# Patient Record
Sex: Male | Born: 1965 | Race: White | Hispanic: No | Marital: Married | Smoking: Never smoker
Health system: Southern US, Community
[De-identification: ages and names within clinical notes are randomized; demographics above are authoritative.]

---

## 1999-09-27 ENCOUNTER — Emergency Department (HOSPITAL_COMMUNITY): Admission: EM | Admit: 1999-09-27 | Discharge: 1999-09-27 | Payer: Self-pay

## 1999-09-27 ENCOUNTER — Encounter: Payer: Self-pay | Admitting: Emergency Medicine

## 1999-09-29 ENCOUNTER — Encounter: Payer: Self-pay | Admitting: Emergency Medicine

## 1999-09-29 ENCOUNTER — Emergency Department (HOSPITAL_COMMUNITY): Admission: EM | Admit: 1999-09-29 | Discharge: 1999-09-29 | Payer: Self-pay | Admitting: Emergency Medicine

## 1999-10-16 ENCOUNTER — Emergency Department (HOSPITAL_COMMUNITY): Admission: EM | Admit: 1999-10-16 | Discharge: 1999-10-16 | Payer: Self-pay | Admitting: Emergency Medicine

## 2000-05-12 ENCOUNTER — Emergency Department (HOSPITAL_COMMUNITY): Admission: EM | Admit: 2000-05-12 | Discharge: 2000-05-13 | Payer: Self-pay | Admitting: Emergency Medicine

## 2000-05-12 ENCOUNTER — Encounter: Payer: Self-pay | Admitting: Emergency Medicine

## 2000-05-31 ENCOUNTER — Emergency Department (HOSPITAL_COMMUNITY): Admission: EM | Admit: 2000-05-31 | Discharge: 2000-05-31 | Payer: Self-pay | Admitting: Emergency Medicine

## 2000-05-31 ENCOUNTER — Encounter: Payer: Self-pay | Admitting: Emergency Medicine

## 2001-05-11 ENCOUNTER — Emergency Department (HOSPITAL_COMMUNITY): Admission: EM | Admit: 2001-05-11 | Discharge: 2001-05-11 | Payer: Self-pay | Admitting: Emergency Medicine

## 2001-10-28 ENCOUNTER — Observation Stay (HOSPITAL_COMMUNITY): Admission: EM | Admit: 2001-10-28 | Discharge: 2001-10-29 | Payer: Self-pay

## 2003-01-02 ENCOUNTER — Emergency Department (HOSPITAL_COMMUNITY): Admission: EM | Admit: 2003-01-02 | Discharge: 2003-01-02 | Payer: Self-pay | Admitting: Emergency Medicine

## 2007-03-07 ENCOUNTER — Emergency Department (HOSPITAL_COMMUNITY): Admission: EM | Admit: 2007-03-07 | Discharge: 2007-03-07 | Payer: Self-pay | Admitting: Emergency Medicine

## 2007-12-25 ENCOUNTER — Emergency Department (HOSPITAL_COMMUNITY): Admission: EM | Admit: 2007-12-25 | Discharge: 2007-12-26 | Payer: Self-pay | Admitting: Emergency Medicine

## 2009-08-21 HISTORY — PX: EYE SURGERY: SHX253

## 2010-04-03 ENCOUNTER — Ambulatory Visit: Payer: Self-pay | Admitting: Interventional Radiology

## 2010-04-03 ENCOUNTER — Emergency Department (HOSPITAL_BASED_OUTPATIENT_CLINIC_OR_DEPARTMENT_OTHER): Admission: EM | Admit: 2010-04-03 | Discharge: 2010-04-03 | Payer: Self-pay | Admitting: Emergency Medicine

## 2010-10-29 ENCOUNTER — Emergency Department (INDEPENDENT_AMBULATORY_CARE_PROVIDER_SITE_OTHER): Payer: BC Managed Care – PPO

## 2010-10-29 ENCOUNTER — Emergency Department (HOSPITAL_BASED_OUTPATIENT_CLINIC_OR_DEPARTMENT_OTHER)
Admission: EM | Admit: 2010-10-29 | Discharge: 2010-10-30 | Disposition: A | Discharge status: Home / Self Care | Payer: BC Managed Care – PPO | Attending: Emergency Medicine | Admitting: Emergency Medicine

## 2010-10-29 ENCOUNTER — Emergency Department (HOSPITAL_BASED_OUTPATIENT_CLINIC_OR_DEPARTMENT_OTHER): Payer: BC Managed Care – PPO

## 2010-10-29 DIAGNOSIS — M542 Cervicalgia: Secondary | ICD-10-CM

## 2010-10-29 DIAGNOSIS — R51 Headache: Secondary | ICD-10-CM

## 2010-10-29 DIAGNOSIS — J329 Chronic sinusitis, unspecified: Secondary | ICD-10-CM

## 2010-10-29 DIAGNOSIS — E041 Nontoxic single thyroid nodule: Secondary | ICD-10-CM

## 2010-10-29 DIAGNOSIS — F411 Generalized anxiety disorder: Secondary | ICD-10-CM | POA: Insufficient documentation

## 2010-10-29 DIAGNOSIS — R42 Dizziness and giddiness: Secondary | ICD-10-CM

## 2010-10-29 DIAGNOSIS — H9319 Tinnitus, unspecified ear: Secondary | ICD-10-CM | POA: Insufficient documentation

## 2010-10-29 DIAGNOSIS — E049 Nontoxic goiter, unspecified: Secondary | ICD-10-CM | POA: Insufficient documentation

## 2010-10-29 LAB — BASIC METABOLIC PANEL
BUN: 14 mg/dL (ref 6–23)
Calcium: 9.4 mg/dL (ref 8.4–10.5)
Creatinine, Ser: 1 mg/dL (ref 0.4–1.5)
GFR calc non Af Amer: >60 mL/min (ref >60)
Glucose, Bld: 107 mg/dL — ABNORMAL HIGH (ref 70–99)
Potassium: 3.9 mEq/L (ref 3.5–5.1)

## 2010-10-29 MED ORDER — IOHEXOL 350 MG/ML SOLN
100.0000 mL | Freq: Once | INTRAVENOUS | Status: AC | PRN
  Administered 2010-10-29: 100 mL via INTRAVENOUS

## 2010-10-29 MED ORDER — IOHEXOL 350 MG/ML SOLN: 100.0000 mL | Freq: Once | INTRAVENOUS | Status: DC | PRN

## 2011-01-01 ENCOUNTER — Emergency Department (HOSPITAL_BASED_OUTPATIENT_CLINIC_OR_DEPARTMENT_OTHER)
Admission: EM | Admit: 2011-01-01 | Discharge: 2011-01-02 | Disposition: A | Discharge status: Home / Self Care | Payer: BC Managed Care – PPO | Attending: Emergency Medicine | Admitting: Emergency Medicine

## 2011-01-01 DIAGNOSIS — M5412 Radiculopathy, cervical region: Secondary | ICD-10-CM | POA: Insufficient documentation

## 2011-01-01 DIAGNOSIS — R209 Unspecified disturbances of skin sensation: Secondary | ICD-10-CM | POA: Insufficient documentation

## 2011-01-06 NOTE — H&P (Signed)
[place]  Patient:    Stephen Berry, Stephen Berry Visit Number: [id number] MRN: [id number]          Service Type: MED Location: 2000 2016 01 Attending Physician:  Talitha Givens Dictated by:   Lavella Hammock, P.A. Admit Date:  10/28/2001                           History and Physical  DATE OF BIRTH:  1966-04-02  CHIEF COMPLAINT:  Chest pain.  HISTORY OF PRESENT ILLNESS:  Mr. Borner is a 45 year old white male with no known history of coronary artery disease.  He had onset of substernal chest pain this morning at about 8 a.m. while sitting at his desk.  He describes it as a pressure, 8/10 at its worst, and was associated with shortness of breath but no nausea, vomiting, or diaphoresis.  It eased off after he came to the emergency room but received no medication.  Total duration was approximately three hours.  He is pain-free at the time of exam.  He has had some previous episodes two or three times a year but not as severe.  There has never been an association with exertion.  He has never tried any medications for these.  He also describes a history of dyspnea on exertion that he states is worst within the last year, as he has gained a significant amount of weight.  He denies paroxysmal nocturnal dyspnea, orthopnea, or palpitations.  He also denies lower extremity edema.  He states that he does have night sweats approximately one time per month and occasional hemoptysis with the last episode of that being approximately two weeks ago.  PAST MEDICAL HISTORY:  He went to an urgent care for a minor medical problem approximately a week or two ago and was told he was hypertensive at that time but has never been diagnosed by a physician.  He has no history of diabetes. His lipids have not been checked within the last five or 10 years.  He is obese.  He has a family history of premature coronary artery disease.  He has never smoked and does not drink  alcohol.  He had an ear infection when he was less than [age] years old that greatly decreased his hearing and he wears a hearing aid in one year.  He had a "bobcat" accident two years ago resulting in a broken arm and some soft tissue injury.  SURGICAL HISTORY:  None.  SOCIAL HISTORY:  He is married and lives in [place] with his wife.  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  He has four children between he and his wife total.  He has never smoked and does not drink alcohol.  He gets no regular exercise.  He has recently decreased his salt intake but states that secondary to job stress his p.o. intake has increased.  FAMILY HISTORY:  His mother is living.  His father died of an MI at age 22 and he had an uncle that died of an MI in his 40s.  He has siblings but none with coronary artery disease.  ALLERGIES:  No known drug allergies.  MEDICATIONS:  No prescription drugs and no over-the-counter medications used consistently except Tums.  REVIEW OF SYSTEMS:  He has night sweats once or twice a month.  He has gained approximately 50 pounds in the past year but his p.o. intake has increased,  notably that he eats supper twice a night several times a week.  He has hearing loss that was as a child and has not changed.  He has hemoptysis about once a month and the last time was last week.  He denies presyncope or wheezing.  He has no GU problems and no musculoskeletal complaints.  He gets gastroesophageal reflux symptoms on a regular basis and goes through a roll of Tums about once a day.  Review of systems is otherwise negative.  PHYSICAL EXAMINATION:  VITAL SIGNS:  Temperature 96.9, blood pressure 175/88, pulse 86 and regular, respirations 28 and not labored, oxygen saturation 100% on room air.  GENERAL:  He is well-developed obese white male in no acute distress.  HEENT:  His head is normocephalic and atraumatic with pupils that are equal, round and  reactive to light and accommodation.  His extraocular movements are intact and sclerae are clear.  Nares are without discharge and oropharynx is without erythema or exudate.  NECK:  Supple and without bruit or JVD.  He has no lymphadenopathy noted.  CARDIOVASCULAR:  His heart is regular in rate and rhythm with an S1 and S2, possibly faint S4.  There is no significant murmur or rub.  His distal pulses are 2+ and equal.  LUNGS:  Clear to auscultation bilaterally.  SKIN:  No significant lesions noted.  ABDOMEN:  Soft and nontender with normal bowel sounds and no hepatosplenomegaly noted.  There is no abdominal bruit noted.  EXTREMITIES:  There is no cyanosis, clubbing, or edema.  No rash, lesions, or petechia noted.  MUSCULOSKELETAL:  There is no joint deformity or effusions, no spine or CVA tenderness.  Distal pulses are intact and no bruits are noted.  NEUROLOGIC:  He is alert and oriented x 3 with cranial nerves II-XII grossly intact with the exception of hearing.  Strength is 5/5 in all extremities and sensation is normal.  LABORATORY DATA:  Chest x-ray:  Mild cardiac enlargement with mild vascular congestion that is a change from 2001 and there are some differences in technique but the heart is also apparently enlarged.  EKG:  He is in sinus rhythm with a rate of 100.  P-R interval is 0.163 with QRS duration 0.96.  There are no acute ischemic changes noted and we have no old EKG.  Laboratory values:  Hemoglobin 15.1 with hematocrit 43.1, wbcs 9.6, platelets 286.  Sodium 134, potassium 4.2, chloride 104, CO2 22, BUN 9, creatinine 1.0, glucose 113.  CK-MB 188/1.2 with troponin I 0.01.  Coags, d-dimer, and TSH are pending at this time.  ASSESSMENT AND PLAN: 1. Chest pain:  He will be admitted and myocardial infarction will be ruled    out.  If enzymes are negative, he will get an adenosine Cardiolite in    the morning.  Feel like his dyspnea on exertion is significant  enough    that he would not tolerate a GXT very well.  We will add an aspirin to     his medication regimen and beta blocker for blood pressure control. 2. Hypertension:  Add Lopressor 25 mg p.o. q.12h. and then ACE inhibitor if    this is not sufficient. 3. Cardiac enlargement:  This is new and we will obtain a 2-D echo. 4. Unknown lipid status:  A check in the a.m. is ordered. 5. Hemoptysis:  Check coags and a d-dimer with further evaluation per M.D.    There is no history of tobacco use, TB exposure, or upper  respiratory    infection. 6. Dyspnea on exertion with weight increase:  Check TSH. 7. Gastroesophageal reflux disease symptoms:  Add Nexium. 8. We will obtain a sputum culture if he has a productive cough. 9. The patient is otherwise stable, see past medical history.  Consider    referral to [place] primary care if cardiac workup is negative. Dictated by:   Lavella Hammock, P.A. Attending Physician:  Talitha Givens DD:  10/28/01 TD:  10/28/01 Job: [id number]

## 2011-01-06 NOTE — Discharge Summary (Signed)
[place]  Patient:    Stephen Berry, Stephen Berry Visit Number: [id number] MRN: [id number]          Service Type: MED Location: 2000 2016 01 Attending Physician:  Talitha Givens Dictated by:   Lavella Hammock, P.A. Admit Date:  10/28/2001                    Referring Physician Discharge [facility]  INCOMPLETE  DATE OF BIRTH:  December 06, 1955 Dictated by:   Lavella Hammock, P.A. Attending Physician:  Talitha Givens DD:  10/28/01 TD:  10/28/01 Job: [id number]

## 2011-01-30 ENCOUNTER — Encounter: Payer: Self-pay | Admitting: Internal Medicine

## 2011-02-01 ENCOUNTER — Ambulatory Visit (INDEPENDENT_AMBULATORY_CARE_PROVIDER_SITE_OTHER): Payer: BC Managed Care – PPO | Admitting: Internal Medicine

## 2011-02-01 ENCOUNTER — Encounter: Payer: Self-pay | Admitting: Internal Medicine

## 2011-02-01 VITALS — BP 118/88 | HR 77 | Temp 97.7°F | Ht 71.0 in | Wt 296.2 lb

## 2011-02-01 DIAGNOSIS — R0600 Dyspnea, unspecified: Secondary | ICD-10-CM | POA: Insufficient documentation

## 2011-02-01 DIAGNOSIS — R0609 Other forms of dyspnea: Secondary | ICD-10-CM

## 2011-02-01 DIAGNOSIS — E669 Obesity, unspecified: Secondary | ICD-10-CM

## 2011-02-01 NOTE — Patient Instructions (Signed)
#  shortness of breath  - this sounds like asthma  - please do irritant breathing test called methacholine challenge test  - after this test, call our office, [phone] for test review  - I will call you within a week of the test and based on test result tell you to come in or order bike pulmonary stress test #weight loss   - follow [facility] diet sheet  - recommend exercise too but after completion of our shortness of breath workout

## 2011-02-01 NOTE — Assessment & Plan Note (Signed)
#  shortness of breath  - this sounds like asthma  - please do irritant breathing test called methacholine challenge test  - after this test, call our office, [phone] for test review  - I will call you within a week of the test and based on test result tell you to come in or order bike pulmonary stress test

## 2011-02-01 NOTE — Assessment & Plan Note (Signed)
Plan Follow [facility] diet sheet from their lipid clinic Avoid exercise till dyspnea workup complete

## 2011-02-01 NOTE — Progress Notes (Signed)
Addended by: Darrell Jewel on: 02/01/2011 10:36 AM   Modules accepted: Orders

## 2011-02-01 NOTE — Progress Notes (Signed)
Addended by: Darrell Jewel on: 02/01/2011 09:47 AM   Modules accepted: Orders

## 2011-02-01 NOTE — Progress Notes (Signed)
  Subjective:    Patient ID: Stephen Berry, male    DOB: Sep 03, 1965, 45 y.o.   MRN: [id number]  HPI 45 year old obese male (330# Jan -> 290# current with intentional weight loss, non-smoker, GERD. C/o dyspnea since march 2012 pollen season. Insidious onset. Not progressive. Feels dyspneic anytime. Unrelated to exertional activities. Aggravated by sprays, smells, perfumes, hair sprays, cig smoke. 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 Past 3 days somewhat better and attributes it to rain.  Dyspneic weekdays (at work job) and weekends (works in farm, Database administrator).  Hx of trauma falling off bobcat and s/p lung contusion with 1 day hx of hospitalization. No hx of birds, dogs, cats in the house. No hx of flour exposure. 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 No hx of working with aluminum.  Denies associated wheeze, chest pain, sputum, hemoptysis, fever, chills, cough, nasal drainage. However, does have GERD but this has significantly improved with intentional weight loss and stopping all cola   Office spirometry today (personally reviwed trace) is normal   Review of Systems  Constitutional: Negative for fever and unexpected weight change.  HENT: Negative for ear pain, nosebleeds, congestion, sore throat, rhinorrhea, sneezing, trouble swallowing, dental problem, postnasal drip and sinus pressure.   Eyes: Negative for redness and itching.  Respiratory: Positive for shortness of breath. Negative for cough, chest tightness and wheezing.   Cardiovascular: Negative for palpitations and leg swelling.  Gastrointestinal: Negative for nausea and vomiting.  Genitourinary: Negative for dysuria.  Musculoskeletal: Negative for joint swelling.  Skin: Negative for rash.  Neurological: Negative for headaches.  Hematological: Does not bruise/bleed easily.  Psychiatric/Behavioral: Negative for dysphoric mood. The  patient is not nervous/anxious.        Objective:   Physical Exam  Nursing note and vitals reviewed. Constitutional: He is oriented to person, place, and time. He appears well-developed and well-nourished. No distress.       obese  HENT:  Head: Normocephalic and atraumatic.  Right Ear: External ear normal.  Left Ear: External ear normal.  Mouth/Throat: Oropharynx is clear and moist. No oropharyngeal exudate.  Eyes: Conjunctivae and EOM are normal. Pupils are equal, round, and reactive to light. Right eye exhibits no discharge. Left eye exhibits no discharge. No scleral icterus.  Neck: Normal range of motion. Neck supple. No JVD present. No tracheal deviation present. No thyromegaly present.  Cardiovascular: Normal rate, regular rhythm and intact distal pulses.  Exam reveals no gallop and no friction rub.   No murmur heard. Pulmonary/Chest: Effort normal and breath sounds normal. No respiratory distress. He has no wheezes. He has no rales. He exhibits no tenderness.  Abdominal: Soft. Bowel sounds are normal. He exhibits no distension and no mass. There is no tenderness. There is no rebound and no guarding.  Musculoskeletal: Normal range of motion. He exhibits no edema and no tenderness.  Lymphadenopathy:    He has no cervical adenopathy.  Neurological: He is alert and oriented to person, place, and time. He has normal reflexes. No cranial nerve deficit. Coordination normal.  Skin: Skin is warm and dry. No rash noted. He is not diaphoretic. No erythema. No pallor.  Psychiatric: He has a normal mood and affect. His behavior is normal. Judgment and thought content normal.          Assessment & Plan:

## 2011-02-07 ENCOUNTER — Encounter (HOSPITAL_COMMUNITY): Payer: BC Managed Care – PPO

## 2011-02-14 ENCOUNTER — Telehealth: Payer: Self-pay | Admitting: Internal Medicine

## 2011-02-14 NOTE — Telephone Encounter (Signed)
He was supposed to have methacholine challenge and it was supposted to be on my desk for review. If this was negative, i was going to get cpst. Did he have his methachioline?

## 2011-02-23 NOTE — Telephone Encounter (Signed)
Spoke with the pt and he states he did not due the test because he was sick at the time. I asked if he wanted to r/s and he states he is feeling better then he was at last OV and will call to r/s in th future if he decides to. Carron Curie, CMA

## 2011-06-05 LAB — URINALYSIS, ROUTINE W REFLEX MICROSCOPIC
Bilirubin Urine: NEGATIVE
Hgb urine dipstick: NEGATIVE
Ketones, ur: NEGATIVE
Specific Gravity, Urine: 1.008
Urobilinogen, UA: 0.2

## 2012-06-10 IMAGING — CT CT ANGIO HEAD
1 of 13 series · 4 of 33 positions shown · IV contrast (APPLIED)
Comparison: 03/07/2007 head CT.

CTA NECK

CLINICAL DATA: Can hear heart beat in ears for past 4 weeks.  No
pain.

CT ANGIOGRAPHY HEAD AND NECK
TECHNIQUE: Multidetector CT imaging of the head and neck was
performed using the standard protocol during bolus administration
of intravenous contrast.  Multiplanar CT image reconstructions
including MIPs were obtained to evaluate the vascular anatomy.
Carotid stenosis measurements (when applicable) are obtained
utilizing NASCET criteria, using the distal internal carotid
diameter as the denominator.
Contrast:  100 ml Omnipaque 350.

[Series 18: ax thin · axial · 0.45mm/px · z∈[-318,-98]mm · 4 of 365 slices shown]
[im 73/365  soft-tissue]
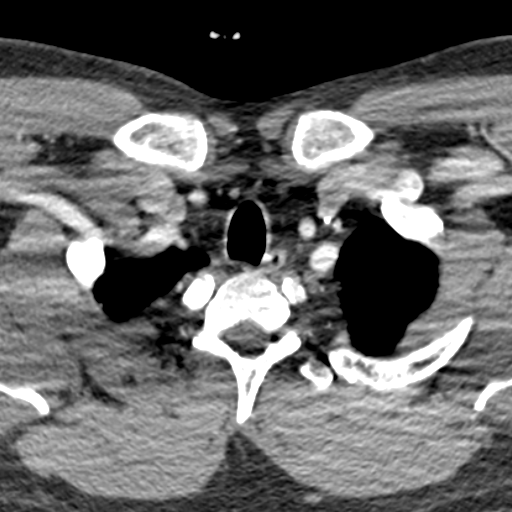
[im 146/365  bone]
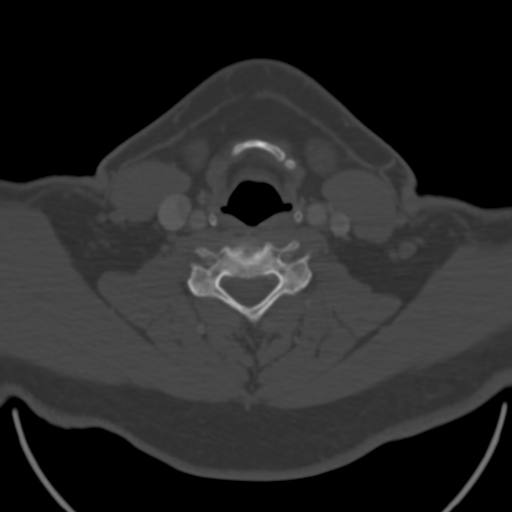
[im 219/365  soft-tissue]
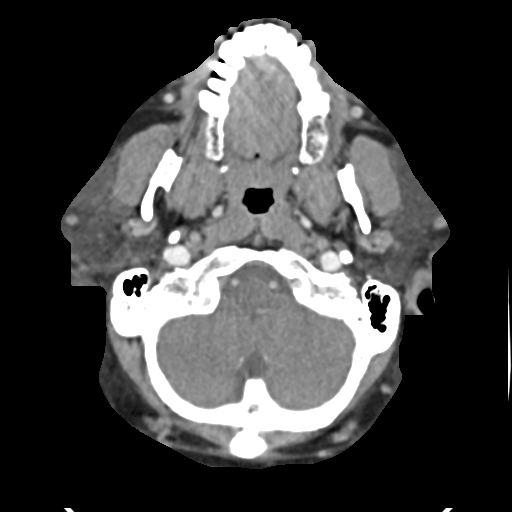
[im 292/365  bone]
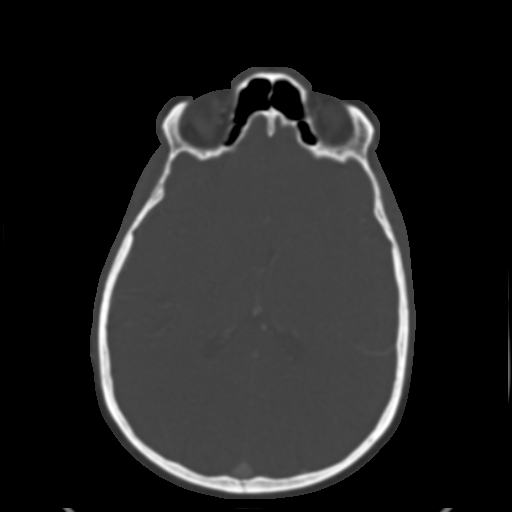

[4 of 33 positions shown; findings below may reference images not displayed]

FINDINGS: Examination limited by streak artifact from dental work
and venous contamination.  No obvious carotid or vertebral artery
dissection.

No hemodynamically significant stenosis involving either carotid
bifurcation.

Mild narrowing proximal left vertebral artery with mild ectasia
just beyond the mild stenosis.

Cervical spondylotic changes most notable C6-7 level.

Scattered increased number of normal sized lymph nodes.  There is a
right level II lymph node which appears slightly rounded with
transverse dimension of 1 x 1.2 cm (series 6 image 93).
Etiology/significance indetermined.

Minimal prominence of soft tissue posterior-superior nasopharynx
appears symmetric.  Mild prominence of the palatine tonsil
bilaterally.  Direct visualization may be considered.

Right lobe of thyroid 1 cm lesion best seen on precontrast images.
This can be followed with thyroid ultrasound.  Lung apices clear.

 Review of the MIP images confirms the above findings.
IMPRESSION: Examination limited by streak artifact from dental work and venous
contamination.  No obvious carotid or vertebral artery dissection.

No hemodynamically significant stenosis involving either carotid
bifurcation.

Mild narrowing proximal left vertebral artery with mild ectasia
just beyond the mild stenosis.

Cervical spondylotic changes most notable C6-7 level.

Scattered increased number of normal sized lymph nodes.  There is a
right level II lymph node which appears slightly rounded with
transverse dimension of 1 x 1.2 cm (series 6 image 93).
Etiology/significance indetermined.

Minimal prominence of soft tissue posterior-superior nasopharynx
appears symmetric.  Mild prominence of the palatine tonsil
bilaterally.  Direct visualization may be considered.

Right lobe of thyroid 1 cm lesion best seen on precontrast images.
This can be followed with thyroid ultrasound.

CTA HEAD
FINDINGS: No intracranial hemorrhage. No CT evidence of large
acute infarct.  Small acute infarct cannot be excluded by CT.  No
abnormal intracranial enhancing lesion.

Nonspecific and possibly partially calcified 7.3 mm left frontal
scalp lesion.

Visualized sinuses and mastoid air cells are clear.  Orbital
structures unremarkable.

Evaluating intracranial vasculature is limited because of the phase
of contrast (where the arterial and venous enhancement at the level
of the cavernous sinus).  All that can be stated with certainty is
that there is flow within the major intracranial vascular
structures.  Evaluating for stenosis, aneurysm or fistula is
limited.  Possibility of dural fistula cannot be addressed by CT.
This would require catheter angiography.

Major dural sinuses are patent.

Review of the MIP images confirms the above findings.
IMPRESSION: Major intracranial vascular structures are patent as noted above.

Results discussed with Dr. Riemkje at time imaging.

## 2017-06-03 ENCOUNTER — Encounter (HOSPITAL_COMMUNITY): Payer: Self-pay | Admitting: Emergency Medicine

## 2017-06-03 ENCOUNTER — Emergency Department (HOSPITAL_COMMUNITY): Payer: Self-pay

## 2017-06-03 ENCOUNTER — Emergency Department (HOSPITAL_COMMUNITY)
Admission: EM | Admit: 2017-06-03 | Discharge: 2017-06-03 | Disposition: A | Discharge status: Home / Self Care | Payer: Self-pay | Attending: Emergency Medicine | Admitting: Emergency Medicine

## 2017-06-03 DIAGNOSIS — R0602 Shortness of breath: Secondary | ICD-10-CM | POA: Insufficient documentation

## 2017-06-03 LAB — CBC
HEMATOCRIT: 43.5 % (ref 39.0–52.0)
HEMOGLOBIN: 14.9 g/dL (ref 13.0–17.0)
MCH: 29.9 pg (ref 26.0–34.0)
MCHC: 34.3 g/dL (ref 30.0–36.0)
MCV: 87.2 fL (ref 78.0–100.0)
Platelets: 261 10*3/uL (ref 150–400)
RBC: 4.99 MIL/uL (ref 4.22–5.81)
RDW: 14 % (ref 11.5–15.5)
WBC: 9.8 10*3/uL (ref 4.0–10.5)

## 2017-06-03 LAB — I-STAT TROPONIN, ED
TROPONIN I, POC: 0 ng/mL (ref 0.00–0.08)
TROPONIN I, POC: 0 ng/mL (ref 0.00–0.08)

## 2017-06-03 LAB — BASIC METABOLIC PANEL
ANION GAP: 8 (ref 5–15)
BUN: 10 mg/dL (ref 6–20)
CHLORIDE: 106 mmol/L (ref 101–111)
CO2: 21 mmol/L — ABNORMAL LOW (ref 22–32)
Calcium: 8.8 mg/dL — ABNORMAL LOW (ref 8.9–10.3)
Creatinine, Ser: 0.89 mg/dL (ref 0.61–1.24)
Glucose, Bld: 120 mg/dL — ABNORMAL HIGH (ref 65–99)
POTASSIUM: 3.9 mmol/L (ref 3.5–5.1)
SODIUM: 135 mmol/L (ref 135–145)

## 2017-06-03 LAB — D-DIMER, QUANTITATIVE (NOT AT ARMC): D DIMER QUANT: 0.45 ug{FEU}/mL (ref 0.00–0.50)

## 2017-06-03 LAB — BRAIN NATRIURETIC PEPTIDE: B NATRIURETIC PEPTIDE 5: 18.2 pg/mL (ref 0.0–100.0)

## 2017-06-03 NOTE — ED Notes (Signed)
ED Provider at bedside. 

## 2017-06-03 NOTE — Discharge Instructions (Addendum)
You were seen in [facility] ED for shortness of breath and heart palpitations. Your blood work and your chest x-ray were all normal. We recommend following up with your regular doctor for further evaluation. He might want to do other tests. Please return to the ED if you start to experience shortness of breath and chest pressure again.

## 2017-06-03 NOTE — ED Notes (Signed)
Family at bedside. 

## 2017-06-03 NOTE — ED Notes (Signed)
Pt provided graham crackers and peanut butter per request

## 2017-06-03 NOTE — ED Provider Notes (Signed)
[facility]-EMERGENCY DEPT Provider Note   CSN: [id number] Arrival date & time: 06/03/17  1324    History   Chief Complaint Chief Complaint  Patient presents with  . Shortness of Breath    HPI Stephen Berry is a 51 y.o. male with history of GERD and hearing loss who presents to the ED from home with shortness of breath. He is accompanied by brother and fiance. Patient states he woke up this morning and suddenly became short of breath while walking around his house. He also reports experiencing subternal chest tightness/pressure and fluttering as well. Denies N/V and diaphoresis. He proceeded to sat down and called EMS. He was given ASA 324 mg by EMS. He was in his usual state of health prior to this. Does not use oxygen at home. Brother states his father died from MI, but patient denies cardiac history. He does report being told his heart was enlarged several years ago. Denies history of asthma or COPD. Has never smoked cigarettes. His fiance reports several episodes of bronchitis in the past, but no chronic lung issues. Denies fever, chills, sick contacts, sinus pressure/congestion, rhinorrhea, abdominal pain, N/V, myalgias, changes in appetite, and changes in bowel movements. Denies history of PE and DVT in the past. He continues to have shortness of breath when seen, but chest tightness has now resolved. He does reports his chest is sore.   HPI  Past Medical History:  Diagnosis Date  . Hearing loss     Patient Active Problem List   Diagnosis Date Noted  . Dyspnea 02/01/2011  . Obesity 02/01/2011    Past Surgical History:  Procedure Laterality Date  . EYE SURGERY  2011     Home Medications    Prior to Admission medications   Not on File  Patient does not take any medications at home.  Family History Family History  Problem Relation Age of Onset  . Heart attack Father     Social History Social History  Substance Use Topics  . Smoking status: Never Smoker  . Smokeless  tobacco: Not on file  . Alcohol use No     Allergies   Penicillins and Codeine   Review of Systems Review of Systems  Constitutional: Negative for activity change, appetite change, chills, diaphoresis, fatigue, fever and unexpected weight change.  HENT: Positive for hearing loss. Negative for congestion, postnasal drip, rhinorrhea, sinus pain, sinus pressure and sore throat.   Respiratory: Positive for chest tightness. Negative for cough, shortness of breath and wheezing.   Cardiovascular: Negative for chest pain, palpitations and leg swelling.  Gastrointestinal: Negative for abdominal pain, blood in stool, constipation, diarrhea, nausea and vomiting.  Genitourinary: Negative for difficulty urinating.  Musculoskeletal: Negative for arthralgias and myalgias.  Neurological: Negative for dizziness, weakness, light-headedness and headaches.  All other systems reviewed and are negative.    Physical Exam Updated Vital Signs BP 129/77   Pulse 78   Resp 12   SpO2 99%   Physical Exam  Constitutional: He is oriented to person, place, and time.  Well.-nourished, well-developed, upset during encounter but in no acute distress, able to speak in full sentences   HENT:  Head: Normocephalic and atraumatic.  Nose: Nose normal.  Mouth/Throat: Oropharynx is clear and moist. No oropharyngeal exudate.  Neck: Normal range of motion. Neck supple.  Cardiovascular: Normal rate, regular rhythm, normal heart sounds and intact distal pulses.  Exam reveals no gallop and no friction rub.   No murmur heard. Pulmonary/Chest: Effort normal and breath sounds  normal. No respiratory distress. He has no wheezes. He has no rales. He exhibits tenderness.  Abdominal: Soft. Bowel sounds are normal. He exhibits no distension. There is no tenderness.  Musculoskeletal: He exhibits no edema.  Neurological: He is alert and oriented to person, place, and time.  Skin: Skin is warm. Capillary refill takes less than 2  seconds.     ED Treatments / Results  Labs (all labs ordered are listed, but only abnormal results are displayed) Labs Reviewed  BASIC METABOLIC PANEL - Abnormal; Notable for the following:       Result Value   CO2 21 (*)    Glucose, Bld 120 (*)    Calcium 8.8 (*)    All other components within normal limits  CBC  BRAIN NATRIURETIC PEPTIDE  D-DIMER, QUANTITATIVE (NOT AT [facility])  I-STAT TROPONIN, ED  I-STAT TROPONIN, ED    EKG  EKG Interpretation  Date/Time:  Sunday June 03 2017 08:33:26 EDT Ventricular Rate:  78 PR Interval:    QRS Duration: 84 QT Interval:  369 QTC Calculation: 421 R Axis:   52 Text Interpretation:  Sinus rhythm Abnormal R-wave progression, early transition No significant change since last tracing Confirmed by Frederick Peers [id number] on 06/03/2017 8:58:18 AM     NSR, nl intervals, no axis deviation, no ST elevation or depression, no T wave inversions, no  blocks noted   Radiology Dg Chest 2 View  Result Date: 06/03/2017 CLINICAL DATA:  Shortness of breath, chest pain EXAM: CHEST  2 VIEW COMPARISON:  12/25/2007 FINDINGS: Lungs are clear.  No pleural effusion or pneumothorax. The heart is normal in size. Visualized osseous structures are within normal limits. IMPRESSION: Normal chest radiographs. Electronically Signed   By: Charline Bills M.D.   On: 06/03/2017 08:57    Procedures Procedures (including critical care time)  Medications Ordered in ED Medications - No data to display   Initial Impression / Assessment and Plan / ED Course  I have reviewed the triage vital signs and the nursing notes.  Pertinent labs & imaging results that were available during my care of the patient were reviewed by me and considered in my medical decision making (see chart for details).    Stephen Berry is a 51 y.o. male with history of GERD and hearing loss who presents to the ED from home with sudden onset of SOB associated with chest tightness/pressure. No  cardiac or pulmonary history. HR 74, normotensive and satting 99% on RA. He appears anxious, but comfortable on exam. Able to speak in full sentences. Cardiac and lung exam benign. EKG negative for acute ischemia. CXR negative. DDx includes  arrhythmia, PE, ACS, HF, PTX, PNA, etc. CBC, BMP, troponin, BNP, D-dimer. Will continue to monitor.   1015 Labwork unremarkable. Will order second troponin. If negative will d/c home. Patient remains hemodynamically stable and satting 99% on room air.   1357 Troponin negative x2. Suspect patient symptoms could be secondary to a transient arrhythmia.Will d/c patient home given negative cardiac workup though this does not rule out a cardiac etiology as explained above. HEART score 2, therefore low risk of MACE in the next 30 days. Advised patient to follow up with PCP. Return precautions discussed with patient. Patient voiced understanding and agrees to plan.  Case discussed with Dr. Clarene Duke.   Final Clinical Impressions(s) / ED Diagnoses   Final diagnoses:  SOB (shortness of breath)    New Prescriptions New Prescriptions   No medications on file  Burna Cash, MD 06/03/17 1415    Little, Ambrose Finland, MD 06/04/17 [phone]

## 2017-06-03 NOTE — ED Notes (Signed)
Pt stated he had to use the restroom before he would allow staff to hook him up to monitor.

## 2017-06-03 NOTE — ED Triage Notes (Signed)
Pt arrives via [facility] from home, ems reports pt got up this am and was walking around when he began having sob. Pt was placed on O2 and stated his chest pain got better, pt was given  asa, no nitro. Pt a/ox4, resp e/u, nad.

## 2017-06-03 NOTE — ED Notes (Signed)
Patient transported to X-ray 

## 2017-12-19 ENCOUNTER — Other Ambulatory Visit: Payer: Self-pay

## 2017-12-19 ENCOUNTER — Encounter (HOSPITAL_BASED_OUTPATIENT_CLINIC_OR_DEPARTMENT_OTHER): Payer: Self-pay

## 2017-12-19 ENCOUNTER — Emergency Department (HOSPITAL_BASED_OUTPATIENT_CLINIC_OR_DEPARTMENT_OTHER)
Admission: EM | Admit: 2017-12-19 | Discharge: 2017-12-19 | Disposition: A | Discharge status: Home / Self Care | Payer: BLUE CROSS/BLUE SHIELD | Attending: Emergency Medicine | Admitting: Emergency Medicine

## 2017-12-19 ENCOUNTER — Emergency Department (HOSPITAL_BASED_OUTPATIENT_CLINIC_OR_DEPARTMENT_OTHER): Payer: BLUE CROSS/BLUE SHIELD

## 2017-12-19 DIAGNOSIS — R739 Hyperglycemia, unspecified: Secondary | ICD-10-CM | POA: Insufficient documentation

## 2017-12-19 DIAGNOSIS — M546 Pain in thoracic spine: Secondary | ICD-10-CM | POA: Diagnosis present

## 2017-12-19 LAB — D-DIMER, QUANTITATIVE (NOT AT ARMC): D DIMER QUANT: 0.57 ug{FEU}/mL — AB (ref 0.00–0.50)

## 2017-12-19 LAB — BASIC METABOLIC PANEL
Anion gap: 10 (ref 5–15)
BUN: 13 mg/dL (ref 6–20)
CHLORIDE: 102 mmol/L (ref 101–111)
CO2: 20 mmol/L — ABNORMAL LOW (ref 22–32)
Calcium: 8.7 mg/dL — ABNORMAL LOW (ref 8.9–10.3)
Creatinine, Ser: 0.87 mg/dL (ref 0.61–1.24)
GFR calc Af Amer: >60 mL/min (ref >60)
GLUCOSE: 282 mg/dL — AB (ref 65–99)
POTASSIUM: 4 mmol/L (ref 3.5–5.1)
SODIUM: 132 mmol/L — AB (ref 135–145)

## 2017-12-19 LAB — CBC WITH DIFFERENTIAL/PLATELET
BASOS PCT: 1 %
Basophils Absolute: 0.1 10*3/uL (ref 0.0–0.1)
Eosinophils Absolute: 0.7 10*3/uL (ref 0.0–0.7)
Eosinophils Relative: 5 %
HEMATOCRIT: 41.6 % (ref 39.0–52.0)
HEMOGLOBIN: 15 g/dL (ref 13.0–17.0)
LYMPHS ABS: 3.6 10*3/uL (ref 0.7–4.0)
LYMPHS PCT: 26 %
MCH: 31.1 pg (ref 26.0–34.0)
MCHC: 36.1 g/dL — AB (ref 30.0–36.0)
MCV: 86.3 fL (ref 78.0–100.0)
MONO ABS: 1.4 10*3/uL — AB (ref 0.1–1.0)
MONOS PCT: 10 %
NEUTROS ABS: 8.2 10*3/uL — AB (ref 1.7–7.7)
Neutrophils Relative %: 58 %
Platelets: 250 10*3/uL (ref 150–400)
RBC: 4.82 MIL/uL (ref 4.22–5.81)
RDW: 13 % (ref 11.5–15.5)
WBC: 14.1 10*3/uL — ABNORMAL HIGH (ref 4.0–10.5)

## 2017-12-19 LAB — TROPONIN I: Troponin I: <0.03 ng/mL (ref <0.03)

## 2017-12-19 MED ORDER — METHOCARBAMOL 500 MG PO TABS: 500.0000 mg | ORAL_TABLET | Freq: Three times a day (TID) | ORAL | 0 refills | Status: AC | PRN

## 2017-12-19 MED ORDER — DIAZEPAM 5 MG/ML IJ SOLN: 5.0000 mg | Freq: Once | INTRAMUSCULAR | Status: DC

## 2017-12-19 MED ORDER — IOPAMIDOL (ISOVUE-370) INJECTION 76%
100.0000 mL | Freq: Once | INTRAVENOUS | Status: AC | PRN
  Administered 2017-12-19: 100 mL via INTRAVENOUS

## 2017-12-19 MED ORDER — NAPROXEN 500 MG PO TABS: 500.0000 mg | ORAL_TABLET | Freq: Two times a day (BID) | ORAL | 0 refills | Status: AC

## 2017-12-19 MED ORDER — DIAZEPAM 5 MG/ML IJ SOLN
2.5000 mg | Freq: Once | INTRAMUSCULAR | Status: DC
  Filled 2017-12-19: qty 2

## 2017-12-19 NOTE — ED Provider Notes (Signed)
[facility] EMERGENCY DEPARTMENT Provider Note   CSN: [id number] Arrival date & time: 12/19/17  1601     History   Chief Complaint Chief Complaint  Patient presents with  . Chest Pain    HPI Earsel Berry is a 52 y.o. male without significant past medical hx who presents to the ED for back pain that started yesterday. Patient describes the pain as being in between in the shoulder blades. States it occurred yesterday at 1500 and resolved after a few hours. Today it began again around 1500 however it has persisted and seemed to be worsen. States pain has started to feel as though it radiates up into the neck. He states it is midline and R sided. States it feels like a dull pain. It is worse if he lays on the area of discomfort. States he took two aspirin prior to presenting to his PCP, he received additional two aspirin at PCP and was instructed to come to the ED for further evaluation. The aspirin has improved his pain at present. States that he has had dyspnea for 1 month which he attributes to allergies, no worse over past 2 days. 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 Denies chest pain, numbness, weakness, change in vision, nausea, vomiting, diaphoresis, leg pain/swelling, hemoptysis, recent surgery/trauma, recent long travel, hormone use, personal hx of cancer, or hx of DVT/PE.   HPI  Past Medical History:  Diagnosis Date  . Hearing loss     Patient Active Problem List   Diagnosis Date Noted  . Dyspnea 02/01/2011  . Obesity 02/01/2011    Past Surgical History:  Procedure Laterality Date  . EYE SURGERY  2011        Home Medications    Prior to Admission medications   Not on File    Family History Family History  Problem Relation Age of Onset  . Heart attack Father     Social History Social History   Tobacco Use  . Smoking status: Never Smoker  . Smokeless tobacco:  Never Used  Substance Use Topics  . Alcohol use: No  . Drug use: No     Allergies   Penicillins and Codeine   Review of Systems Review of Systems  Constitutional: Negative for chills and fever.  Eyes: Negative for visual disturbance.  Respiratory: Positive for shortness of breath (x 1 month).   Cardiovascular: Negative for chest pain and leg swelling.  Gastrointestinal: Negative for abdominal pain, diarrhea, nausea and vomiting.  Musculoskeletal: Positive for back pain and neck pain.  Neurological: Negative for weakness and numbness.  All other systems reviewed and are negative.    Physical Exam Updated Vital Signs BP (!) 168/89 (BP Location: Left Arm)   Pulse (!) 114   Temp 99 F (37.2 C) (Oral)   Resp 20   Ht  (1.803 m)   Wt (!) 141.8 kg (312 lb 9.8 oz)   SpO2 100%   BMI 43.60 kg/m   Physical Exam  Constitutional: He appears well-developed and well-nourished. No distress.  HENT:  Head: Normocephalic and atraumatic.  Eyes: Conjunctivae are normal. Right eye exhibits no discharge. Left eye exhibits no discharge.  Cardiovascular: Regular rhythm. Tachycardia present.  No murmur heard. Pulses:      Radial pulses are 2+ on the right side, and 2+ on the left side.  Pulmonary/Chest: Effort normal and breath sounds normal. No respiratory distress. He has no decreased breath  sounds. He has no wheezes. He has no rales. He exhibits no tenderness, no crepitus and no edema.  Abdominal: Soft. He exhibits no distension. There is no tenderness.  Musculoskeletal:       Right lower leg: He exhibits no tenderness and no edema.       Left lower leg: He exhibits no tenderness and no edema.  No obvious deformity, appreciable swelling, erythema, ecchymosis, or overlying warmth. No open wounds:  Back: patient has diffuse upper thoracic midline tenderness to palpation. He is tender to palpation in the left thoracic and cervical paraspinal muscle regions, most prominently over the  left trapezius muscle.   Neurological: He is alert.  Clear speech.   Skin: Skin is warm and dry. No rash noted.  Psychiatric: He has a normal mood and affect. His behavior is normal.  Nursing note and vitals reviewed.  ED Treatments / Results  Labs Results for orders placed or performed during the hospital encounter of 12/19/17  CBC with Differential  Result Value Ref Range   WBC 14.1 (H) 4.0 - 10.5 K/uL   RBC 4.82 4.22 - 5.81 MIL/uL   Hemoglobin 15.0 13.0 - 17.0 g/dL   HCT 16.1 09.6 - 04.5 %   MCV 86.3 78.0 - 100.0 fL   MCH 31.1 26.0 - 34.0 pg   MCHC 36.1 (H) 30.0 - 36.0 g/dL   RDW 40.9 81.1 - 91.4 %   Platelets 250 150 - 400 K/uL   Neutrophils Relative % 58 %   Neutro Abs 8.2 (H) 1.7 - 7.7 K/uL   Lymphocytes Relative 26 %   Lymphs Abs 3.6 0.7 - 4.0 K/uL   Monocytes Relative 10 %   Monocytes Absolute 1.4 (H) 0.1 - 1.0 K/uL   Eosinophils Relative 5 %   Eosinophils Absolute 0.7 0.0 - 0.7 K/uL   Basophils Relative 1 %   Basophils Absolute 0.1 0.0 - 0.1 K/uL  Troponin I  Result Value Ref Range   Troponin I <0.03 <0.03 ng/mL  D-dimer, quantitative (not at [facility])  Result Value Ref Range   D-Dimer, Quant 0.57 (H) 0.00 - 0.50 ug/mL-FEU  Basic metabolic panel  Result Value Ref Range   Sodium 132 (L) 135 - 145 mmol/L   Potassium 4.0 3.5 - 5.1 mmol/L   Chloride 102 101 - 111 mmol/L   CO2 20 (L) 22 - 32 mmol/L   Glucose, Bld 282 (H) 65 - 99 mg/dL   BUN 13 6 - 20 mg/dL   Creatinine, Ser 7.82 0.61 - 1.24 mg/dL   Calcium 8.7 (L) 8.9 - 10.3 mg/dL   GFR calc non Af Amer >60 >60 mL/min   GFR calc Af Amer >60 >60 mL/min   Anion gap 10 5 - 15  Troponin I  Result Value Ref Range   Troponin I <0.03 <0.03 ng/mL    EKG EKG Interpretation  Date/Time:  Wednesday Dec 19 2017 16:12:48 EDT Ventricular Rate:  115 PR Interval:  164 QRS Duration: 80 QT Interval:  316 QTC Calculation: 437 R Axis:   76 Text Interpretation:  Sinus tachycardia Nonspecific T wave abnormality Abnormal ECG  T wave changes with tachycardia new since Oct 2018 Confirmed by Pricilla Loveless [phone]) on 12/19/2017 4:19:37 PM   Radiology Dg Chest 2 View  Result Date: 12/19/2017 CLINICAL DATA:  Chest and back pain for 2 days. EXAM: CHEST - 2 VIEW COMPARISON:  06/03/2017 FINDINGS: The heart size and mediastinal contours are within normal limits. Both lungs are clear. The visualized  skeletal structures are unremarkable. IMPRESSION: Negative.  No active cardiopulmonary disease. Electronically Signed   By: Myles Rosenthal M.D.   On: 12/19/2017 18:14   Dg Thoracic Spine 2 View  Result Date: 12/19/2017 CLINICAL DATA:  Thoracic back pain for 2 days. EXAM: THORACIC SPINE 2 VIEWS COMPARISON:  None. FINDINGS: There is no evidence of thoracic spine fracture. Alignment is normal. No other significant bone abnormalities are identified. IMPRESSION: Negative. Electronically Signed   By: Myles Rosenthal M.D.   On: 12/19/2017 18:14   Ct Angio Chest Pe W/cm &/or Wo Cm  Result Date: 12/19/2017 CLINICAL DATA:  Chest pain x2 days EXAM: CT ANGIOGRAPHY CHEST WITH CONTRAST TECHNIQUE: Multidetector CT imaging of the chest was performed using the standard protocol during bolus administration of intravenous contrast. Multiplanar CT image reconstructions and MIPs were obtained to evaluate the vascular anatomy. CONTRAST:  ISOVUE-370 IOPAMIDOL (ISOVUE-370) INJECTION 76% COMPARISON:  Chest radiographs dated 12/19/2017 FINDINGS: Cardiovascular: Satisfactory opacification the bilateral pulmonary artery to the segmental level. No evidence of pulmonary embolism. No evidence of thoracic aortic aneurysm or dissection. The heart is normal in size.  No pericardial effusion. Mild coronary atherosclerosis of the LAD. Mediastinum/Nodes: Small mediastinal lymph nodes which do not meet pathologic CT size criteria. Visualized thyroid is unremarkable. Lungs/Pleura: Lungs are clear. No suspicious pulmonary nodules. No focal consolidation. No pleural effusion or  pneumothorax. Upper Abdomen: Visualized upper abdomen is notable for moderate hepatic steatosis. Musculoskeletal: Visualized osseous structures are within normal limits. Review of the MIP images confirms the above findings. IMPRESSION: No evidence of pulmonary embolism. Negative CT chest. Electronically Signed   By: Charline Bills M.D.   On: 12/19/2017 18:41    Procedures Procedures (including critical care time)  Medications Ordered in ED Medications  diazepam (VALIUM) injection 2.5 mg (2.5 mg Intravenous Refused 12/19/17 1716)  iopamidol (ISOVUE-370) 76 % injection 100 mL (100 mLs Intravenous Contrast Given 12/19/17 1820)    Initial Impression / Assessment and Plan / ED Course  I have reviewed the triage vital signs and the nursing notes.  Pertinent labs & imaging results that were available during my care of the patient were reviewed by me and considered in my medical decision making (see chart for details).   Patient presents with back pain. Patient is nontoxic appearing, noted to be tachycardic and hypertensive (suspicion for initial triage BP elevated secondary to improper cuff size given improvement with large sized cuff that is appropriate for patient's size on my evaluation shortly after, however patient remains hypertensive none the less). Patient's pain is reproducible with palpation of the diffuse upper thoracic spine as well as right thoracic/cervical paraspinal muscles. DDX: ACS, pulmonary embolism, dissection, vertebral fracture/dislocation, muscle strain. Will evaluate with EKG, CXR , Thoracic spine X-ray as well as labs including troponin and d-dimer. Will treat pain with valium.   D-dimer elevated- CTA ordered.   Labs reviewed: Nonspecific leukocytosis at 14.1. No anemia. Mild hyponatremia at 132. Hyperglycemia at 282- patient states he had multiple candy bars today- discussed importance of need for recheck with his PCP. Troponin WNL.  Ekg with nonspecific T wave changes and  tachycardia. Chest x-ray negative.  Thoracic spine x-ray negative.  Pending CTA to evaluate for pulmonary embolism.   CTA negative for acute abnormalities, negative for pulmonary embolism.  18:55: RE-EVAL: Patient feeling much better, resting comfortably, aware of results thus far- pending delta troponin.   Patient's pain is nonexertional, troponin negative x2, doubt ACS.  CTA negative for pulmonary embolism.  Patient's  pain is not a tearing sensation, he has 2+ symmetric radial pulses, there is no widening of the mediastinum on imaging, doubt dissection.  His x-ray of his thoracic spine was negative,  He has no point/focal vertebral tenderness, therefore doubt fracture or dislocation of the spine.  No definitive etiology to patient's pain, however highly suspicious for musculoskeletal etiology. with reproducibility.  Patient declined Valium in the emergency department.  Will send home with naproxen and Robaxin, instructed patient is not to drive or operate heavy machinery when taking Robaxin. I discussed results, treatment plan, need for PCP follow-up, and return precautions with the patient. Provided opportunity for questions, patient confirmed understanding and is in agreement with plan.    Vitals:   12/19/17 2000 12/19/17 2030  BP: 127/66 124/69  Pulse: 83 81  Resp: 13 13  Temp: 98 F (36.7 C)   SpO2: 98% 100%   Final Clinical Impressions(s) / ED Diagnoses   Final diagnoses:  Acute left-sided thoracic back pain  Hyperglycemia    ED Discharge Orders        Ordered    naproxen (NAPROSYN) 500 MG tablet  2 times daily     12/19/17 2049    methocarbamol (ROBAXIN) 500 MG tablet  Every 8 hours PRN     12/19/17 2049       Efton Thomley, [place] R, PA-C 12/20/17 0021    Pricilla Loveless, MD 12/20/17 [phone]

## 2017-12-19 NOTE — ED Notes (Signed)
Pt on monitor 

## 2017-12-19 NOTE — ED Triage Notes (Signed)
C/o CP day 2-NAD-steady gait 

## 2017-12-19 NOTE — Discharge Instructions (Addendum)
You are seen in the emergency department today for pain in your back.  Your EKG in the enzyme leads to lift her heart was reassuring.  The imaging we did of your chest in your back did not show any abnormal findings.  Your blood sugar was somewhat elevated today at 282, it is important to have this rechecked by her primary care provider.  We are unsure the exact cause of your pain, however we are suspicious this is related to a muscle irritation.  We are treating her pain with the following medications:  Naproxen is a nonsteroidal anti-inflammatory medication that will help with pain and swelling. Be sure to take this medication as prescribed with food, 1 pill every 12 hours,  It should be taken with food, as it can cause stomach upset, and more seriously, stomach bleeding. Do not take other nonsteroidal anti-inflammatory medications with this such as Advil, Motrin, or Aleve.   Robaxin is the muscle relaxer I have prescribed, this is meant to help with muscle tightness. Be aware that this medication may make you drowsy therefore the first time you take this it should be at a time you are in an environment where you can rest. Do not drive or operate heavy machinery when taking this medication.    We have prescribed you new medication(s) today. Discuss the medications prescribed today with your pharmacist as they can have adverse effects and interactions with your other medicines including over the counter and prescribed medications. Seek medical evaluation if you start to experience new or abnormal symptoms after taking one of these medicines, seek care immediately if you start to experience difficulty breathing, feeling of your throat closing, facial swelling, or rash as these could be indications of a more serious allergic reaction   Follow-up with your primary care provider in 3 to 5 days for reevaluation of your symptoms as well as your blood sugar.  If you do not have a primary care provider there is a  phone number circled your discharge instructions.  Return to the ER anytime for any new or worsening symptoms including but not limited to chest pain, increased pain in your back, trouble breathing, numbness, weakness, change in your vision, passing out, or any other concerns that you may have.

## 2017-12-19 NOTE — ED Notes (Signed)
Pt states does not want valium at this time

## 2020-04-08 ENCOUNTER — Ambulatory Visit (HOSPITAL_COMMUNITY)
Admission: RE | Admit: 2020-04-08 | Discharge: 2020-04-08 | Disposition: A | Discharge status: Home / Self Care | Payer: BLUE CROSS/BLUE SHIELD | Source: Ambulatory Visit | Attending: Pulmonary Disease | Admitting: Pulmonary Disease

## 2020-04-08 ENCOUNTER — Other Ambulatory Visit (HOSPITAL_COMMUNITY): Payer: Self-pay | Admitting: Oncology

## 2020-04-08 DIAGNOSIS — U071 COVID-19: Secondary | ICD-10-CM | POA: Diagnosis present

## 2020-04-08 MED ORDER — METHYLPREDNISOLONE SODIUM SUCC 125 MG IJ SOLR: 125.0000 mg | Freq: Once | INTRAMUSCULAR | Status: DC | PRN

## 2020-04-08 MED ORDER — SODIUM CHLORIDE 0.9 % IV SOLN: INTRAVENOUS | Status: DC | PRN

## 2020-04-08 MED ORDER — EPINEPHRINE 0.3 MG/0.3ML IJ SOAJ: 0.3000 mg | Freq: Once | INTRAMUSCULAR | Status: DC | PRN

## 2020-04-08 MED ORDER — ALBUTEROL SULFATE HFA 108 (90 BASE) MCG/ACT IN AERS: 2.0000 | INHALATION_SPRAY | Freq: Once | RESPIRATORY_TRACT | Status: DC | PRN

## 2020-04-08 MED ORDER — SODIUM CHLORIDE 0.9 % IV SOLN
1200.0000 mg | Freq: Once | INTRAVENOUS | Status: AC
  Administered 2020-04-08: 1200 mg via INTRAVENOUS
  Filled 2020-04-08: qty 10

## 2020-04-08 MED ORDER — DIPHENHYDRAMINE HCL 50 MG/ML IJ SOLN: 50.0000 mg | Freq: Once | INTRAMUSCULAR | Status: DC | PRN

## 2020-04-08 MED ORDER — FAMOTIDINE IN NACL 20-0.9 MG/50ML-% IV SOLN: 20.0000 mg | Freq: Once | INTRAVENOUS | Status: DC | PRN

## 2020-04-08 NOTE — Progress Notes (Signed)
I connected by phone with  Stephen Berry on 04/08/20 at 1:30pm  to discuss the potential use of an new treatment for mild to moderate COVID-19 viral infection in non-hospitalized patients.   This patient is a age/sex that meets the FDA criteria for Emergency Use Authorization of casirivimab\imdevimab.  Has a (+) direct SARS-CoV-2 viral test result 1. Has mild or moderate COVID-19  2. Is ? 56 years of age and weighs ? 40 kg 3. Is NOT hospitalized due to COVID-19 4. Is NOT requiring oxygen therapy or requiring an increase in baseline oxygen flow rate due to COVID-19 5. Is within 10 days of symptom onset 6. Has at least one of the high risk factor(s) for progression to severe COVID-19 and/or hospitalization as defined in EUA. ? Specific high risk criteria :Obesity   Symptom onset  04/02/20   I have spoken and communicated the following to the patient or parent/caregiver:   1. FDA has authorized the emergency use of casirivimab\imdevimab for the treatment of mild to moderate COVID-19 in adults and pediatric patients with positive results of direct SARS-CoV-2 viral testing who are 102 years of age and older weighing at least 40 kg, and who are at high risk for progressing to severe COVID-19 and/or hospitalization.   2. The significant known and potential risks and benefits of casirivimab\imdevimab, and the extent to which such potential risks and benefits are unknown.   3. Information on available alternative treatments and the risks and benefits of those alternatives, including clinical trials.   4. Patients treated with casirivimab\imdevimab should continue to self-isolate and use infection control measures (e.g., wear mask, isolate, social distance, avoid sharing personal items, clean and disinfect "high touch" surfaces, and frequent handwashing) according to CDC guidelines.    5. The patient or parent/caregiver has the option to accept or refuse casirivimab\imdevimab .   After reviewing this  information with the patient, The patient agreed to proceed with receiving casirivimab\imdevimab infusion and will be provided a copy of the Fact sheet prior to receiving the infusion.Mignon Pine, AGNP-C [phone] (Infusion Center Hotline)

## 2020-04-08 NOTE — Discharge Instructions (Signed)

## 2020-04-08 NOTE — Progress Notes (Signed)
  Diagnosis: COVID-19  Physician:Dr Wright  Procedure: [facility] Med: casirivimab\imdevimab infusion - Provided patient with casirivimab\imdevimab fact sheet for patients, parents and caregivers prior to infusion.  Complications: No immediate complications noted.  Discharge: Discharged home   Stephen Berry 04/08/2020
